# Patient Record
Sex: Male | Born: 1935 | Race: White | Hispanic: No | Marital: Married | Smoking: Current every day smoker
Health system: Southern US, Community
[De-identification: ages and names within clinical notes are randomized; demographics above are authoritative.]

## PROBLEM LIST (undated history)

## (undated) DIAGNOSIS — F319 Bipolar disorder, unspecified: Secondary | ICD-10-CM

## (undated) DIAGNOSIS — I350 Nonrheumatic aortic (valve) stenosis: Secondary | ICD-10-CM

## (undated) DIAGNOSIS — C801 Malignant (primary) neoplasm, unspecified: Secondary | ICD-10-CM

## (undated) DIAGNOSIS — M2141 Flat foot [pes planus] (acquired), right foot: Secondary | ICD-10-CM

## (undated) DIAGNOSIS — I10 Essential (primary) hypertension: Secondary | ICD-10-CM

## (undated) DIAGNOSIS — E785 Hyperlipidemia, unspecified: Secondary | ICD-10-CM

## (undated) DIAGNOSIS — M2142 Flat foot [pes planus] (acquired), left foot: Secondary | ICD-10-CM

## (undated) DIAGNOSIS — E119 Type 2 diabetes mellitus without complications: Secondary | ICD-10-CM

## (undated) HISTORY — DX: Hyperlipidemia, unspecified: E78.5

## (undated) HISTORY — DX: Bipolar disorder, unspecified: F31.9

## (undated) HISTORY — DX: Nonrheumatic aortic (valve) stenosis: I35.0

## (undated) HISTORY — DX: Flat foot (pes planus) (acquired), left foot: M21.41

## (undated) HISTORY — DX: Flat foot (pes planus) (acquired), left foot: M21.42

## (undated) HISTORY — DX: Essential (primary) hypertension: I10

## (undated) HISTORY — PX: KNEE SURGERY: SHX244

## (undated) HISTORY — DX: Type 2 diabetes mellitus without complications: E11.9

## (undated) HISTORY — DX: Malignant (primary) neoplasm, unspecified: C80.1

---

## 2011-03-24 DIAGNOSIS — I350 Nonrheumatic aortic (valve) stenosis: Secondary | ICD-10-CM

## 2011-03-24 DIAGNOSIS — E119 Type 2 diabetes mellitus without complications: Secondary | ICD-10-CM

## 2011-03-24 DIAGNOSIS — G4700 Insomnia, unspecified: Secondary | ICD-10-CM

## 2011-03-24 DIAGNOSIS — E785 Hyperlipidemia, unspecified: Secondary | ICD-10-CM | POA: Insufficient documentation

## 2011-03-24 DIAGNOSIS — I10 Essential (primary) hypertension: Secondary | ICD-10-CM | POA: Insufficient documentation

## 2011-03-24 DIAGNOSIS — F319 Bipolar disorder, unspecified: Secondary | ICD-10-CM

## 2011-03-24 DIAGNOSIS — Z8546 Personal history of malignant neoplasm of prostate: Secondary | ICD-10-CM | POA: Insufficient documentation

## 2011-03-24 DIAGNOSIS — N189 Chronic kidney disease, unspecified: Secondary | ICD-10-CM

## 2013-03-26 ENCOUNTER — Telehealth: Payer: Self-pay | Admitting: Physician Assistant

## 2013-03-26 NOTE — Telephone Encounter (Signed)
  Daughter of pt says Dad stopped all meds back in November because of expense.  She has refilled all but the trajenta. Her  Dad says he won't pay for it.  Please restart him on metformin  and send the refill to [facility] in [facility].

## 2013-03-27 NOTE — Telephone Encounter (Signed)
Cost is not the issue, the health of his kidneys is the reason metformin was discontinued. Please understand what you are asking for and the consequences. Janie I have put a bag of samples of Tradjenta at nursing station A

## 2013-03-28 ENCOUNTER — Other Ambulatory Visit: Payer: Self-pay

## 2013-03-28 ENCOUNTER — Other Ambulatory Visit: Payer: Self-pay | Admitting: *Deleted

## 2013-03-28 MED ORDER — TAMSULOSIN HCL 0.4 MG PO CAPS: 0.4000 mg | ORAL_CAPSULE | Freq: Every day | ORAL | Status: DC

## 2013-03-28 NOTE — Telephone Encounter (Signed)
Tradjenta samples qty 8 boxes at front for pt to come by for.

## 2013-04-30 ENCOUNTER — Encounter: Payer: Self-pay | Admitting: Family Medicine

## 2013-04-30 ENCOUNTER — Ambulatory Visit (INDEPENDENT_AMBULATORY_CARE_PROVIDER_SITE_OTHER): Payer: Medicare Other | Admitting: Family Medicine

## 2013-04-30 VITALS — BP 128/69 | HR 74 | Temp 97.0°F | Ht 68.0 in | Wt 217.4 lb

## 2013-04-30 DIAGNOSIS — I10 Essential (primary) hypertension: Secondary | ICD-10-CM

## 2013-04-30 DIAGNOSIS — M25569 Pain in unspecified knee: Secondary | ICD-10-CM

## 2013-04-30 DIAGNOSIS — E785 Hyperlipidemia, unspecified: Secondary | ICD-10-CM

## 2013-04-30 DIAGNOSIS — Z8546 Personal history of malignant neoplasm of prostate: Secondary | ICD-10-CM

## 2013-04-30 DIAGNOSIS — N189 Chronic kidney disease, unspecified: Secondary | ICD-10-CM

## 2013-04-30 DIAGNOSIS — I350 Nonrheumatic aortic (valve) stenosis: Secondary | ICD-10-CM

## 2013-04-30 DIAGNOSIS — G4700 Insomnia, unspecified: Secondary | ICD-10-CM

## 2013-04-30 DIAGNOSIS — E119 Type 2 diabetes mellitus without complications: Secondary | ICD-10-CM

## 2013-04-30 DIAGNOSIS — F319 Bipolar disorder, unspecified: Secondary | ICD-10-CM

## 2013-04-30 DIAGNOSIS — M25562 Pain in left knee: Secondary | ICD-10-CM | POA: Insufficient documentation

## 2013-04-30 DIAGNOSIS — I359 Nonrheumatic aortic valve disorder, unspecified: Secondary | ICD-10-CM

## 2013-04-30 LAB — HEPATIC FUNCTION PANEL
ALT: 23 U/L (ref 0–53)
AST: 19 U/L (ref 0–37)
Albumin: 4.3 g/dL (ref 3.5–5.2)
Alkaline Phosphatase: 45 U/L (ref 39–117)
Bilirubin, Direct: 0.1 mg/dL (ref 0.0–0.3)
Indirect Bilirubin: 0.2 mg/dL (ref 0.0–0.9)
Total Bilirubin: 0.3 mg/dL (ref 0.3–1.2)
Total Protein: 7.1 g/dL (ref 6.0–8.3)

## 2013-04-30 LAB — BASIC METABOLIC PANEL
BUN: 16 mg/dL (ref 6–23)
CO2: 25 mEq/L (ref 19–32)
Calcium: 9.1 mg/dL (ref 8.4–10.5)
Chloride: 107 mEq/L (ref 96–112)
Creat: 1.36 mg/dL — ABNORMAL HIGH (ref 0.50–1.35)
Glucose, Bld: 98 mg/dL (ref 70–99)
Potassium: 4.5 mEq/L (ref 3.5–5.3)
Sodium: 142 mEq/L (ref 135–145)

## 2013-04-30 LAB — POCT GLYCOSYLATED HEMOGLOBIN (HGB A1C): Hemoglobin A1C: 5.9

## 2013-04-30 MED ORDER — DICLOFENAC SODIUM 1 % TD GEL: 2.0000 g | Freq: Four times a day (QID) | TRANSDERMAL | Status: DC

## 2013-04-30 NOTE — Addendum Note (Signed)
Addended by: Lisbeth Ply C on: 04/30/2013 03:09 PM   Modules accepted: Orders

## 2013-04-30 NOTE — Telephone Encounter (Signed)
This encounter was created in error - please disregard.

## 2013-04-30 NOTE — Progress Notes (Signed)
Subjective:     Patient ID: Charles Moyer, male   DOB: [birth year], 79 y.o.   MRN: [id number]  HPI Seen by Psychiatrist in [place]. Sees Cardiologist in [place] for Ao Stenosis. Otherwise doing well. S/P left TKR approx. 10 years ago and it is  Sore some times.  Patient is here for follow up of Diabetes Mellitus.Symptoms of DM:has had no Nocturia ,deniesUrinary Frequency ,denies Blurred vision ,deniesDizziness,denies.Dysuria,deniesparesthesias, deniesextremity pain or ulcers.Charles Kitchendenieschest pain. .has hadan annual eye exam. do check the feet. doescheck CBGs. Average CBG:_120_______.Charles Moyer deniesto episodes of hypoglycemia. doeshave an emergency hypoglycemic plan. admits toCompliance with medications. deniesProblems with medications.  No past medical history on file. No past surgical history on file. History   Social History  . Marital Status: Married    Spouse Name: N/A    Number of Children: N/A  . Years of Education: N/A   Occupational History  . Not on file.   Social History Main Topics  . Smoking status: Former Smoker    Types: Cigars    Quit date: 04/30/1973  . Smokeless tobacco: Not on file     Comment: cigar after he eats meal  . Alcohol Use: Not on file  . Drug Use: Not on file  . Sexually Active: Not on file   Other Topics Concern  . Not on file   Social History Narrative  . No narrative on file   No family history on file. Current Outpatient Prescriptions on File Prior to Visit  Medication Sig Dispense Refill  . amLODipine (NORVASC) 10 MG tablet Take 10 mg by mouth daily.        Charles Moyer aspirin 81 MG tablet Take 81 mg by mouth daily.        . fenofibrate (TRICOR) 145 MG tablet Take 145 mg by mouth at bedtime.        . Linagliptin (TRADJENTA) 5 MG TABS Take 1 tablet by mouth daily.        Charles Moyer lisinopril (PRINIVIL,ZESTRIL) 40 MG tablet Take 40 mg by mouth daily.        Charles Moyer omega-3 acid ethyl esters (LOVAZA) 1 G capsule Take 2 g by mouth 2 (two) times daily.        .  pantoprazole (PROTONIX) 40 MG tablet Take 40 mg by mouth daily.        . QUEtiapine (SEROQUEL) 300 MG tablet Take 300 mg by mouth at bedtime.        . rosuvastatin (CRESTOR) 20 MG tablet Take 20 mg by mouth at bedtime.        . tamsulosin (FLOMAX) 0.4 MG CAPS Take 1 capsule (0.4 mg total) by mouth daily.  30 capsule  6  . carbamazepine (CARBATROL) 100 MG 12 hr capsule Take 100 mg by mouth 2 (two) times daily.        . clonazePAM (KLONOPIN) 0.5 MG tablet Take 0.5 mg by mouth 2 (two) times daily as needed.        . doxepin (SINEQUAN) 25 MG capsule Take 25 mg by mouth at bedtime.         No current facility-administered medications on file prior to visit.   Allergies  Allergen Reactions  . Niaspan (Niacin Er) Rash   Immunization History  Administered Date(s) Administered  . Pneumococcal Polysaccharide 05/27/1997  . Td 05/27/1997   Prior to Admission medications   Medication Sig Start Date End Date Taking? Authorizing Provider  amLODipine (NORVASC) 10 MG tablet Take 10 mg by mouth daily.  Yes Historical Provider, MD  aspirin 81 MG tablet Take 81 mg by mouth daily.     Yes Historical Provider, MD  fenofibrate (TRICOR) 145 MG tablet Take 145 mg by mouth at bedtime.     Yes Historical Provider, MD  Linagliptin (TRADJENTA) 5 MG TABS Take 1 tablet by mouth daily.     Yes Historical Provider, MD  lisinopril (PRINIVIL,ZESTRIL) 40 MG tablet Take 40 mg by mouth daily.     Yes Historical Provider, MD  omega-3 acid ethyl esters (LOVAZA) 1 G capsule Take 2 g by mouth 2 (two) times daily.     Yes Historical Provider, MD  pantoprazole (PROTONIX) 40 MG tablet Take 40 mg by mouth daily.     Yes Historical Provider, MD  QUEtiapine (SEROQUEL) 300 MG tablet Take 300 mg by mouth at bedtime.     Yes Historical Provider, MD  rosuvastatin (CRESTOR) 20 MG tablet Take 20 mg by mouth at bedtime.     Yes Historical Provider, MD  tamsulosin (FLOMAX) 0.4 MG CAPS Take 1 capsule (0.4 mg total) by mouth daily. 03/28/13   Yes Horald Pollen, PA-C  carbamazepine (CARBATROL) 100 MG 12 hr capsule Take 100 mg by mouth 2 (two) times daily.      Historical Provider, MD  clonazePAM (KLONOPIN) 0.5 MG tablet Take 0.5 mg by mouth 2 (two) times daily as needed.      Historical Provider, MD  doxepin (SINEQUAN) 25 MG capsule Take 25 mg by mouth at bedtime.      Historical Provider, MD    Review of Systems  All other systems reviewed and are negative.       Objective:   Physical Exam APPEARANCE: WM Patient in no acute distress.The patient appeared well nourished and normally developed. Acyanotic. Waist:Obese VITAL SIGNS:BP 128/69  Pulse 74  Temp(Src) 97 F (36.1 C) (Oral)  Ht 5\' 8"  (1.727 m)  Wt 217 lb 6.4 oz (98.612 kg)  BMI 33.06 kg/m2   SKIN: warm and  Dry without overt rashes, tattoos. Scar: Left anterior knee from old TKR  HEAD and Neck: without JVD, Head and scalp: normal Eyes:No scleral icterus. Fundi normal, eye movements normal. Ears: Auricle normal, canal normal, Tympanic membranes normal, insufflation normal. Nose: normal Throat: normal Neck & thyroid: normal  CHEST & LUNGS: Chest wall: normal Lungs: Clear  CVS: Reveals the PMI to be normally located. Regular rhythm, First and Second Heart sounds are normal,  2/6 Systolic murmur at Aortic Area., rubs or gallops. Peripheral vasculature: Radial pulses: normal Dorsal pedis pulses: normal Posterior pulses: normal  ABDOMEN:  Appearanceobese Benign,, no organomegaly, no masses, no Abdominal Aortic enlargement. No Guarding , no rebound. No Bruits. Bowel sounds: normal  EXTREMETIES: nonedematous. Both Femoral and Pedal pulses are normal.  MUSCULOSKELETAL:  Spine: normal Joints:left knee decreased ROM Discomfort on extremes.  NEUROLOGIC: oriented to time,place and person; nonfocal.      Assessment:     Essential hypertension, benign - Plan: Basic metabolic panel, CANCELED: COMPLETE METABOLIC PANEL WITH GFR  Other and  unspecified hyperlipidemia - Plan: Hepatic function panel, NMR Lipoprofile with Lipids  Aortic stenosis  Bipolar 1 disorder  CKD (chronic kidney disease) - Plan: CANCELED: COMPLETE METABOLIC PANEL WITH GFR  DM (diabetes mellitus) - Plan: POCT glycosylated hemoglobin (Hb A1C), POCT UA - Microalbumin  History of prostate cancer  HTN (hypertension) - Plan: CANCELED: COMPLETE METABOLIC PANEL WITH GFR  Hyperlipidemia - Plan: CANCELED: COMPLETE METABOLIC PANEL WITH GFR, CANCELED: NMR Lipoprofile with Lipids  Insomnia  Left knee pain - Plan: diclofenac sodium (VOLTAREN) 1 % GEL      Plan:     Meds ordered this encounter  Medications  . diclofenac sodium (VOLTAREN) 1 % GEL    Sig: Apply 2 g topically 4 (four) times daily.    Dispense:  100 g    Refill:  2   Orders Placed This Encounter  Procedures  . Basic metabolic panel  . Hepatic function panel  . NMR Lipoprofile with Lipids  . POCT glycosylated hemoglobin (Hb A1C)  . POCT UA - Microalbumin   Discussed with patient need to continue to diet and get his weight below 200 lbs. He has been successful in losing weight so far.  RTC in 3 months. Tashya Alberty P. Modesto Charon, M.D.

## 2013-05-01 ENCOUNTER — Other Ambulatory Visit: Payer: Self-pay | Admitting: *Deleted

## 2013-05-01 MED ORDER — AMLODIPINE BESYLATE 10 MG PO TABS: 10.0000 mg | ORAL_TABLET | Freq: Every day | ORAL | Status: DC

## 2013-05-01 MED ORDER — LISINOPRIL 40 MG PO TABS: 40.0000 mg | ORAL_TABLET | Freq: Every day | ORAL | Status: DC

## 2013-05-02 ENCOUNTER — Other Ambulatory Visit: Payer: Self-pay

## 2013-05-02 LAB — NMR LIPOPROFILE WITH LIPIDS
Cholesterol, Total: 187 mg/dL (ref <200)
HDL Particle Number: 22.6 umol/L — ABNORMAL LOW (ref >=30.5)
HDL Size: 9.1 nm — ABNORMAL LOW (ref >=9.2)
HDL-C: 24 mg/dL — ABNORMAL LOW (ref >=40)
LDL (calc): 102 mg/dL — ABNORMAL HIGH (ref <100)
LDL Particle Number: 1880 nmol/L — ABNORMAL HIGH (ref <1000)
LDL Size: 19.7 nm — ABNORMAL LOW (ref >20.5)
LP-IR Score: 66 — ABNORMAL HIGH (ref <=45)
Large HDL-P: <1.3 umol/L — ABNORMAL LOW (ref >=4.8)
Large VLDL-P: 8.5 nmol/L — ABNORMAL HIGH (ref <=2.7)
Small LDL Particle Number: 1523 nmol/L — ABNORMAL HIGH (ref <=527)
Triglycerides: 304 mg/dL — ABNORMAL HIGH (ref <150)
VLDL Size: 47 nm — ABNORMAL HIGH (ref <=46.6)

## 2013-05-02 MED ORDER — FENOFIBRATE 145 MG PO TABS: 145.0000 mg | ORAL_TABLET | Freq: Every day | ORAL | Status: DC

## 2013-05-31 ENCOUNTER — Ambulatory Visit: Payer: Self-pay

## 2013-07-17 ENCOUNTER — Ambulatory Visit: Payer: Medicare Other | Admitting: Family Medicine

## 2013-07-24 ENCOUNTER — Telehealth: Payer: Self-pay | Admitting: Family Medicine

## 2013-07-24 ENCOUNTER — Other Ambulatory Visit: Payer: Self-pay | Admitting: Family Medicine

## 2013-07-26 ENCOUNTER — Other Ambulatory Visit: Payer: Self-pay | Admitting: *Deleted

## 2013-07-26 MED ORDER — AMLODIPINE BESYLATE 10 MG PO TABS: 10.0000 mg | ORAL_TABLET | Freq: Every day | ORAL | Status: DC

## 2013-07-26 MED ORDER — LISINOPRIL 40 MG PO TABS: 40.0000 mg | ORAL_TABLET | Freq: Every day | ORAL | Status: DC

## 2013-07-26 MED ORDER — PANTOPRAZOLE SODIUM 40 MG PO TBEC: 40.0000 mg | DELAYED_RELEASE_TABLET | Freq: Every day | ORAL | Status: DC

## 2013-07-26 MED ORDER — LINAGLIPTIN 5 MG PO TABS: 5.0000 mg | ORAL_TABLET | Freq: Every day | ORAL | Status: DC

## 2013-08-07 ENCOUNTER — Ambulatory Visit: Payer: Medicare Other | Admitting: Family Medicine

## 2013-08-28 ENCOUNTER — Telehealth: Payer: Self-pay | Admitting: Family Medicine

## 2013-08-29 NOTE — Telephone Encounter (Signed)
Pt has refills, he is aware

## 2013-08-30 ENCOUNTER — Ambulatory Visit: Payer: Self-pay | Admitting: Family Medicine

## 2013-09-24 ENCOUNTER — Other Ambulatory Visit: Payer: Self-pay | Admitting: Family Medicine

## 2013-10-05 ENCOUNTER — Ambulatory Visit (INDEPENDENT_AMBULATORY_CARE_PROVIDER_SITE_OTHER): Payer: Medicare Other | Admitting: Family Medicine

## 2013-10-05 ENCOUNTER — Encounter: Payer: Self-pay | Admitting: Family Medicine

## 2013-10-05 ENCOUNTER — Ambulatory Visit (INDEPENDENT_AMBULATORY_CARE_PROVIDER_SITE_OTHER): Payer: Medicare Other

## 2013-10-05 VITALS — BP 88/56 | HR 98 | Temp 97.5°F | Ht 67.0 in | Wt 220.0 lb

## 2013-10-05 DIAGNOSIS — I359 Nonrheumatic aortic valve disorder, unspecified: Secondary | ICD-10-CM

## 2013-10-05 DIAGNOSIS — I10 Essential (primary) hypertension: Secondary | ICD-10-CM

## 2013-10-05 DIAGNOSIS — R0609 Other forms of dyspnea: Secondary | ICD-10-CM

## 2013-10-05 DIAGNOSIS — M25562 Pain in left knee: Secondary | ICD-10-CM

## 2013-10-05 DIAGNOSIS — E119 Type 2 diabetes mellitus without complications: Secondary | ICD-10-CM

## 2013-10-05 DIAGNOSIS — E785 Hyperlipidemia, unspecified: Secondary | ICD-10-CM

## 2013-10-05 DIAGNOSIS — Z8546 Personal history of malignant neoplasm of prostate: Secondary | ICD-10-CM

## 2013-10-05 DIAGNOSIS — M25569 Pain in unspecified knee: Secondary | ICD-10-CM

## 2013-10-05 DIAGNOSIS — I350 Nonrheumatic aortic (valve) stenosis: Secondary | ICD-10-CM

## 2013-10-05 DIAGNOSIS — C801 Malignant (primary) neoplasm, unspecified: Secondary | ICD-10-CM | POA: Insufficient documentation

## 2013-10-05 DIAGNOSIS — F319 Bipolar disorder, unspecified: Secondary | ICD-10-CM

## 2013-10-05 DIAGNOSIS — R0600 Dyspnea, unspecified: Secondary | ICD-10-CM

## 2013-10-05 DIAGNOSIS — G4700 Insomnia, unspecified: Secondary | ICD-10-CM

## 2013-10-05 DIAGNOSIS — N189 Chronic kidney disease, unspecified: Secondary | ICD-10-CM

## 2013-10-05 LAB — POCT CBC
Granulocyte percent: 75.5 %G (ref 37–80)
HCT, POC: 35.2 % — AB (ref 43.5–53.7)
Hemoglobin: 11.9 g/dL — AB (ref 14.1–18.1)
Lymph, poc: 1.5 (ref 0.6–3.4)
MCH, POC: 32.6 pg — AB (ref 27–31.2)
MCHC: 33.9 g/dL (ref 31.8–35.4)
MCV: 96.1 fL (ref 80–97)
MPV: 7.8 fL (ref 0–99.8)
POC Granulocyte: 5.4 (ref 2–6.9)
POC LYMPH PERCENT: 21.3 %L (ref 10–50)
Platelet Count, POC: 219 10*3/uL (ref 142–424)
RBC: 3.7 M/uL — AB (ref 4.69–6.13)
RDW, POC: 13.7 %
WBC: 7.1 10*3/uL (ref 4.6–10.2)

## 2013-10-05 LAB — POCT GLYCOSYLATED HEMOGLOBIN (HGB A1C): Hemoglobin A1C: 5.4

## 2013-10-05 NOTE — Progress Notes (Signed)
Patient ID: Charles Moyer, male   DOB: 1926/11/19, 79 y.o.   MRN: [id number] SUBJECTIVE: CC: Chief Complaint  Patient presents with  . Follow-up    c/o SOB  & Left knee pain. Dr Lorenso Courier is his heart doctor from [place] .     HPI: Short winded with exertion.no chest pain. Has been experiencing this for several weeks. Has not seen his Cardiologist for months.no pedal edema. No palpitations. No melena. Past Medical History  Diagnosis Date  . Hypertension   . Hyperlipidemia   . Diabetes mellitus without complication   . Aortic stenosis   . Bipolar disorder    No past surgical history on file. History   Social History  . Marital Status: Married    Spouse Name: N/A    Number of Children: N/A  . Years of Education: N/A   Occupational History  . Not on file.   Social History Main Topics  . Smoking status: Current Every Day Smoker    Types: Cigars    Last Attempt to Quit: 04/30/1973  . Smokeless tobacco: Not on file     Comment: cigar after he eats meal  . Alcohol Use: Not on file  . Drug Use: Not on file  . Sexual Activity: Not on file   Other Topics Concern  . Not on file   Social History Narrative  . No narrative on file   No family history on file. Current Outpatient Prescriptions on File Prior to Visit  Medication Sig Dispense Refill  . amLODipine (NORVASC) 10 MG tablet TAKE 1 TABLET (10 MG TOTAL) BY MOUTH DAILY.  30 tablet  5  . aspirin 81 MG tablet Take 81 mg by mouth daily.        . carbamazepine (CARBATROL) 100 MG 12 hr capsule Take 100 mg by mouth 2 (two) times daily.        . fenofibrate (TRICOR) 145 MG tablet Take 1 tablet (145 mg total) by mouth at bedtime.  30 tablet  5  . lisinopril (PRINIVIL,ZESTRIL) 40 MG tablet TAKE 1 TABLET (40 MG TOTAL) BY MOUTH DAILY.  30 tablet  5  . pantoprazole (PROTONIX) 40 MG tablet TAKE 1 TABLET (40 MG TOTAL) BY MOUTH DAILY.  30 tablet  5  . QUEtiapine (SEROQUEL) 300 MG tablet Take 300 mg by mouth at bedtime.        .  tamsulosin (FLOMAX) 0.4 MG CAPS Take 1 capsule (0.4 mg total) by mouth daily.  30 capsule  6  . TRADJENTA 5 MG TABS tablet TAKE 1 TABLET (5 MG TOTAL) BY MOUTH DAILY.  30 tablet  2  . clonazePAM (KLONOPIN) 0.5 MG tablet Take 0.5 mg by mouth 2 (two) times daily as needed.        . diclofenac sodium (VOLTAREN) 1 % GEL Apply 2 g topically 4 (four) times daily.  100 g  2  . doxepin (SINEQUAN) 25 MG capsule Take 25 mg by mouth at bedtime.        [name] omega-3 acid ethyl esters (LOVAZA) 1 G capsule Take 2 g by mouth 2 (two) times daily.        . rosuvastatin (CRESTOR) 20 MG tablet Take 20 mg by mouth at bedtime.         No current facility-administered medications on file prior to visit.   Allergies  Allergen Reactions  . Niaspan [Niacin Er] Rash   Immunization History  Administered Date(s) Administered  . Pneumococcal Polysaccharide 05/27/1997  . Td 05/27/1997  Prior to Admission medications   Medication Sig Start Date End Date Taking? Authorizing Provider  amLODipine (NORVASC) 10 MG tablet TAKE 1 TABLET (10 MG TOTAL) BY MOUTH DAILY. 09/24/13  Yes Ileana Ladd, MD  aspirin 81 MG tablet Take 81 mg by mouth daily.     Yes Historical Provider, MD  carbamazepine (CARBATROL) 100 MG 12 hr capsule Take 100 mg by mouth 2 (two) times daily.     Yes Historical Provider, MD  fenofibrate (TRICOR) 145 MG tablet Take 1 tablet (145 mg total) by mouth at bedtime. 05/02/13  Yes Ileana Ladd, MD  lisinopril (PRINIVIL,ZESTRIL) 40 MG tablet TAKE 1 TABLET (40 MG TOTAL) BY MOUTH DAILY. 09/24/13  Yes Ileana Ladd, MD  pantoprazole (PROTONIX) 40 MG tablet TAKE 1 TABLET (40 MG TOTAL) BY MOUTH DAILY. 09/24/13  Yes Ileana Ladd, MD  QUEtiapine (SEROQUEL) 300 MG tablet Take 300 mg by mouth at bedtime.     Yes Historical Provider, MD  tamsulosin (FLOMAX) 0.4 MG CAPS Take 1 capsule (0.4 mg total) by mouth daily. 03/28/13  Yes Horald Pollen, PA-C  TRADJENTA 5 MG TABS tablet TAKE 1 TABLET (5 MG TOTAL) BY MOUTH DAILY. 09/24/13   Yes Ileana Ladd, MD  clonazePAM (KLONOPIN) 0.5 MG tablet Take 0.5 mg by mouth 2 (two) times daily as needed.      Historical Provider, MD  diclofenac sodium (VOLTAREN) 1 % GEL Apply 2 g topically 4 (four) times daily. 04/30/13   Ileana Ladd, MD  doxepin (SINEQUAN) 25 MG capsule Take 25 mg by mouth at bedtime.      Historical Provider, MD  omega-3 acid ethyl esters (LOVAZA) 1 G capsule Take 2 g by mouth 2 (two) times daily.      Historical Provider, MD  rosuvastatin (CRESTOR) 20 MG tablet Take 20 mg by mouth at bedtime.      Historical Provider, MD     ROS: As above in the HPI. All other systems are stable or negative.  OBJECTIVE: APPEARANCE:  Patient in no acute distress.The patient appeared well nourished and normally developed. Acyanotic. Waist: VITAL SIGNS:BP 88/56  Pulse 98  Temp(Src) 97.5 F (36.4 C) (Oral)  Ht 5\' 7"  (1.702 m)  Wt 220 lb (99.791 kg)  BMI 34.45 kg/m2  SpO2 92% WM elderly pale. Obese SKIN: warm and  Dry without overt rashes, tattoos and scars  HEAD and Neck: without JVD, Head and scalp: normal Eyes:No scleral icterus. Fundi normal, eye movements normal. Ears: Auricle normal, canal normal, Tympanic membranes normal, insufflation normal. Nose: normal Throat: normal Neck & thyroid: normal  CHEST & LUNGS: Chest wall: normal Lungs: Clear  CVS: Reveals the PMI to be normally located. Regular rhythm, First and Second Heart sounds are normal,  Systolic ejection murmur 2/6 at the aortic area,no rubs or gallops. Peripheral vasculature: Radial pulses: normal Dorsal pedis pulses: normal Posterior pulses: normal  ABDOMEN:  Appearance: obese Benign, no organomegaly, no masses, no Abdominal Aortic enlargement. No Guarding , no rebound. No Bruits. Bowel sounds: normal  RECTAL: N/A GU: N/A  EXTREMETIES:  Trace edema   NEUROLOGIC: oriented to time,place and person; nonfocal.  ASSESSMENT: Aortic stenosis  Bipolar 1 disorder  DM (diabetes  mellitus) - Plan: POCT glycosylated hemoglobin (Hb A1C), CMP14+EGFR  CKD (chronic kidney disease) - Plan: CMP14+EGFR  History of prostate cancer  HTN (hypertension) - Plan: CMP14+EGFR  Hyperlipidemia - Plan: NMR, lipoprofile  Insomnia  Left knee pain  Dyspnea - Plan: DG Chest 2  View, EKG 12-Lead, POCT CBC, CMP14+EGFR, Brain natriuretic peptide  I suspect that his dyspnea is due to the aortic stenosis.   PLAN: Orders Placed This Encounter  Procedures  . DG Chest 2 View    Standing Status: Future     Number of Occurrences: 1     Standing Expiration Date: 12/05/2014    Order Specific Question:  Reason for Exam (SYMPTOM  OR DIAGNOSIS REQUIRED)    Answer:  dyspnea    Order Specific Question:  Preferred imaging location?    Answer:  Internal  . CMP14+EGFR  . NMR, lipoprofile  . Brain natriuretic peptide  . POCT CBC  . POCT glycosylated hemoglobin (Hb A1C)  . EKG 12-Lead   [facility] reading (PRIMARY) by  Dr. Modesto Charon: No CHF, chronic  Changes.  EKG: RBBB with an occasional PVC. The report on the previous EKG is the same in [facility] from [place]. Could not see an actual EKG.  Results for orders placed in visit on 10/05/13  POCT CBC      Result Value Range   WBC 7.1  4.6 - 10.2 K/uL   Lymph, poc 1.5  0.6 - 3.4   POC LYMPH PERCENT 21.3  10 - 50 %L   POC Granulocyte 5.4  2 - 6.9   Granulocyte percent 75.5  37 - 80 %G   RBC 3.7 (*) 4.69 - 6.13 M/uL   Hemoglobin 11.9 (*) 14.1 - 18.1 g/dL   HCT, POC 16.1 (*) 09.6 - 53.7 %   MCV 96.1  80 - 97 fL   MCH, POC 32.6 (*) 27 - 31.2 pg   MCHC 33.9  31.8 - 35.4 g/dL   RDW, POC 04.5     Platelet Count, POC 219.0  142 - 424 K/uL   MPV 7.8  0 - 99.8 fL  POCT GLYCOSYLATED HEMOGLOBIN (HGB A1C)      Result Value Range   Hemoglobin A1C 5.4%     Advised daughter and patient that from the records available patient were from 2012.. With the Aortic Stenosis I request that they see the patient's cardiologist in the next few days for and Echocardiogram and  re-evaluation. To [facility] stat if any acute worsening. Reviewed his records from [facility] available in Care Everywhere in [facility]. No Follow-up on file.Pending the Cardiology evaluation  Tyliah Schlereth P. Modesto Charon, M.D.

## 2013-10-06 LAB — CMP14+EGFR
ALT: 13 IU/L (ref 0–44)
AST: 11 IU/L (ref 0–40)
Albumin/Globulin Ratio: 1.4 (ref 1.1–2.5)
Albumin: 3.9 g/dL (ref 3.5–4.7)
Alkaline Phosphatase: 35 IU/L — ABNORMAL LOW (ref 39–117)
BUN/Creatinine Ratio: 11 (ref 10–22)
BUN: 25 mg/dL (ref 8–27)
CO2: 23 mmol/L (ref 18–29)
Calcium: 9.2 mg/dL (ref 8.6–10.2)
Chloride: 108 mmol/L (ref 97–108)
Creatinine, Ser: 2.24 mg/dL — ABNORMAL HIGH (ref 0.76–1.27)
GFR calc Af Amer: 30 mL/min/{1.73_m2} — ABNORMAL LOW (ref >59)
GFR calc non Af Amer: 26 mL/min/{1.73_m2} — ABNORMAL LOW (ref >59)
Globulin, Total: 2.7 g/dL (ref 1.5–4.5)
Glucose: 116 mg/dL — ABNORMAL HIGH (ref 65–99)
Potassium: 5.1 mmol/L (ref 3.5–5.2)
Sodium: 146 mmol/L — ABNORMAL HIGH (ref 134–144)
Total Bilirubin: 0.3 mg/dL (ref 0.0–1.2)
Total Protein: 6.6 g/dL (ref 6.0–8.5)

## 2013-10-06 LAB — NMR, LIPOPROFILE
Cholesterol: 224 mg/dL — ABNORMAL HIGH (ref <200)
HDL Cholesterol by NMR: 23 mg/dL — ABNORMAL LOW (ref >=40)
HDL Particle Number: 22.1 umol/L — ABNORMAL LOW (ref >=30.5)
LDL Particle Number: 2890 nmol/L — ABNORMAL HIGH (ref <1000)
LDL Size: 19.9 nm — ABNORMAL LOW (ref >20.5)
LDLC SERPL CALC-MCNC: 138 mg/dL — ABNORMAL HIGH (ref <100)
LP-IR Score: 58 — ABNORMAL HIGH (ref <=45)
Small LDL Particle Number: >2300 nmol/L — ABNORMAL HIGH (ref <=527)
Triglycerides by NMR: 317 mg/dL — ABNORMAL HIGH (ref <150)

## 2013-10-06 LAB — BRAIN NATRIURETIC PEPTIDE: BNP: 192 pg/mL — ABNORMAL HIGH (ref 0.0–100.0)

## 2013-10-09 ENCOUNTER — Telehealth: Payer: Self-pay | Admitting: Family Medicine

## 2013-10-09 NOTE — Telephone Encounter (Signed)
Pt notified of lab results

## 2013-10-30 ENCOUNTER — Other Ambulatory Visit: Payer: Self-pay | Admitting: Physician Assistant

## 2013-12-22 ENCOUNTER — Other Ambulatory Visit: Payer: Self-pay | Admitting: Family Medicine

## 2014-04-01 ENCOUNTER — Encounter: Payer: Self-pay | Admitting: Family Medicine

## 2014-04-01 ENCOUNTER — Ambulatory Visit (INDEPENDENT_AMBULATORY_CARE_PROVIDER_SITE_OTHER): Payer: Medicare Other | Admitting: Family Medicine

## 2014-04-01 VITALS — BP 128/68 | HR 77 | Temp 99.2°F | Ht 67.0 in | Wt 213.6 lb

## 2014-04-01 DIAGNOSIS — G4700 Insomnia, unspecified: Secondary | ICD-10-CM

## 2014-04-01 DIAGNOSIS — M2141 Flat foot [pes planus] (acquired), right foot: Secondary | ICD-10-CM | POA: Insufficient documentation

## 2014-04-01 DIAGNOSIS — F319 Bipolar disorder, unspecified: Secondary | ICD-10-CM

## 2014-04-01 DIAGNOSIS — M2140 Flat foot [pes planus] (acquired), unspecified foot: Secondary | ICD-10-CM

## 2014-04-01 DIAGNOSIS — N189 Chronic kidney disease, unspecified: Secondary | ICD-10-CM

## 2014-04-01 DIAGNOSIS — I10 Essential (primary) hypertension: Secondary | ICD-10-CM

## 2014-04-01 DIAGNOSIS — Z23 Encounter for immunization: Secondary | ICD-10-CM

## 2014-04-01 DIAGNOSIS — Z8546 Personal history of malignant neoplasm of prostate: Secondary | ICD-10-CM

## 2014-04-01 DIAGNOSIS — E785 Hyperlipidemia, unspecified: Secondary | ICD-10-CM

## 2014-04-01 DIAGNOSIS — M2142 Flat foot [pes planus] (acquired), left foot: Secondary | ICD-10-CM

## 2014-04-01 DIAGNOSIS — I359 Nonrheumatic aortic valve disorder, unspecified: Secondary | ICD-10-CM

## 2014-04-01 DIAGNOSIS — I350 Nonrheumatic aortic (valve) stenosis: Secondary | ICD-10-CM

## 2014-04-01 DIAGNOSIS — E119 Type 2 diabetes mellitus without complications: Secondary | ICD-10-CM

## 2014-04-01 NOTE — Progress Notes (Signed)
Patient ID: Charles Moyer, male   DOB: June 22, 1926, 80 y.o.   MRN: [id number] SUBJECTIVE: CC: Chief Complaint  Patient presents with  . Follow-up    6 month follow up see paper from [employer] recommending to evaluate his meds    HPI:   Patient is here for follow up of Diabetes Mellitus/HTn/HLD: Symptoms evaluated: Denies Nocturia ,Denies Urinary Frequency , denies Blurred vision ,deniesDizziness,denies.Dysuria,denies paresthesias, denies extremity pain or ulcers.Marland Kitchendenies chest pain. has had an annual eye exam. do check the feet. Does check CBGs. Average ZWC:HENIDP Denies episodes of hypoglycemia. Does have an emergency hypoglycemic plan. admits toCompliance with medications. Denies Problems with medications.  Has had a phone interview with BC/BS with recommendations for medicine changes. 1) for his HTN the recommendation was to double the amlodipine. Patient had said that he believes his BP is high sometimes. He calls a systolic BP of 824 high. 2) he wanted a cheaper DM medicine, but he has elevated Creatinine and CKD. 3) the recommendation was to change his psychiatric medication to melatonin and he has Bipolar I disorder.  Past Medical History  Diagnosis Date  . Hypertension   . Hyperlipidemia   . Diabetes mellitus without complication   . Aortic stenosis   . Bipolar disorder   . Cancer     prostate  . Pes planus of both feet    No past surgical history on file. History   Social History  . Marital Status: Married    Spouse Name: N/A    Number of Children: N/A  . Years of Education: N/A   Occupational History  . Not on file.   Social History Main Topics  . Smoking status: Current Every Day Smoker    Types: Cigars    Last Attempt to Quit: 04/30/1973  . Smokeless tobacco: Not on file     Comment: cigar after he eats meal  . Alcohol Use: Not on file  . Drug Use: Not on file  . Sexual Activity: Not on file   Other Topics Concern  . Not on file    Social History Narrative  . No narrative on file   No family history on file. Current Outpatient Prescriptions on File Prior to Visit  Medication Sig Dispense Refill  . amLODipine (NORVASC) 10 MG tablet TAKE 1 TABLET (10 MG TOTAL) BY MOUTH DAILY.  30 tablet  5  . aspirin 81 MG tablet Take 81 mg by mouth daily.        . carbamazepine (CARBATROL) 100 MG 12 hr capsule Take 100 mg by mouth 2 (two) times daily.        . clonazePAM (KLONOPIN) 0.5 MG tablet Take 0.5 mg by mouth 2 (two) times daily as needed.        . diclofenac sodium (VOLTAREN) 1 % GEL Apply 2 g topically 4 (four) times daily.  100 g  2  . doxepin (SINEQUAN) 25 MG capsule Take 25 mg by mouth at bedtime.        . fenofibrate (TRICOR) 145 MG tablet Take 1 tablet (145 mg total) by mouth at bedtime.  30 tablet  5  . lisinopril (PRINIVIL,ZESTRIL) 40 MG tablet TAKE 1 TABLET (40 MG TOTAL) BY MOUTH DAILY.  30 tablet  5  . omega-3 acid ethyl esters (LOVAZA) 1 G capsule Take 2 g by mouth 2 (two) times daily.        . pantoprazole (PROTONIX) 40 MG tablet TAKE 1 TABLET (40 MG TOTAL) BY MOUTH  DAILY.  30 tablet  5  . QUEtiapine (SEROQUEL) 300 MG tablet Take 300 mg by mouth at bedtime.        . rosuvastatin (CRESTOR) 20 MG tablet Take 20 mg by mouth at bedtime.        . tamsulosin (FLOMAX) 0.4 MG CAPS capsule TAKE 1 CAPSULE (0.4 MG TOTAL) BY MOUTH DAILY.  30 capsule  4  . TRADJENTA 5 MG TABS tablet TAKE 1 TABLET (5 MG TOTAL) BY MOUTH DAILY.  30 tablet  3   No current facility-administered medications on file prior to visit.   Allergies  Allergen Reactions  . Niaspan [Niacin Er] Rash   Immunization History  Administered Date(s) Administered  . Influenza Split 11/19/2013  . Pneumococcal Polysaccharide-23 05/27/1997  . Td 05/27/1997  . Tdap 04/01/2014   Prior to Admission medications   Medication Sig Start Date End Date Taking? Authorizing Provider  amLODipine (NORVASC) 10 MG tablet TAKE 1 TABLET (10 MG TOTAL) BY MOUTH DAILY. 09/24/13   Yes Vernie Shanks, MD  aspirin 81 MG tablet Take 81 mg by mouth daily.     Yes Historical Provider, MD  carbamazepine (CARBATROL) 100 MG 12 hr capsule Take 100 mg by mouth 2 (two) times daily.     Yes Historical Provider, MD  clonazePAM (KLONOPIN) 0.5 MG tablet Take 0.5 mg by mouth 2 (two) times daily as needed.     Yes Historical Provider, MD  diclofenac sodium (VOLTAREN) 1 % GEL Apply 2 g topically 4 (four) times daily. 04/30/13  Yes Vernie Shanks, MD  doxepin (SINEQUAN) 25 MG capsule Take 25 mg by mouth at bedtime.     Yes Historical Provider, MD  fenofibrate (TRICOR) 145 MG tablet Take 1 tablet (145 mg total) by mouth at bedtime. 05/02/13  Yes Vernie Shanks, MD  lisinopril (PRINIVIL,ZESTRIL) 40 MG tablet TAKE 1 TABLET (40 MG TOTAL) BY MOUTH DAILY. 09/24/13  Yes Vernie Shanks, MD  omega-3 acid ethyl esters (LOVAZA) 1 G capsule Take 2 g by mouth 2 (two) times daily.     Yes Historical Provider, MD  pantoprazole (PROTONIX) 40 MG tablet TAKE 1 TABLET (40 MG TOTAL) BY MOUTH DAILY. 09/24/13  Yes Vernie Shanks, MD  QUEtiapine (SEROQUEL) 300 MG tablet Take 300 mg by mouth at bedtime.     Yes Historical Provider, MD  rosuvastatin (CRESTOR) 20 MG tablet Take 20 mg by mouth at bedtime.     Yes Historical Provider, MD  tamsulosin (FLOMAX) 0.4 MG CAPS capsule TAKE 1 CAPSULE (0.4 MG TOTAL) BY MOUTH DAILY. 10/30/13  Yes Vernie Shanks, MD  TRADJENTA 5 MG TABS tablet TAKE 1 TABLET (5 MG TOTAL) BY MOUTH DAILY. 12/22/13  Yes Vernie Shanks, MD     ROS: As above in the HPI. All other systems are stable or negative.  OBJECTIVE: APPEARANCE:  Patient in no acute distress.The patient appeared well nourished and normally developed. Acyanotic. Waist: VITAL SIGNS:BP 128/68  Pulse 77  Temp(Src) 99.2 F (37.3 C) (Oral)  Ht $R'5\' 7"'MJ$  (1.702 m)  Wt 213 lb 9.6 oz (96.888 kg)  BMI 33.45 kg/m2 Obese WM with mild flat facies.  SKIN: warm and  Dry without overt rashes, tattoos and scars  HEAD and Neck: without  JVD, Head and scalp: normal Eyes:No scleral icterus. Fundi normal, eye movements normal. Ears: Auricle normal, canal normal, Tympanic membranes normal, insufflation normal. Nose: normal Throat: normal Neck & thyroid: normal  CHEST & LUNGS: Chest wall: normal Lungs: Clear  CVS: Reveals the PMI to be normally located. Regular rhythm, First and Second Heart sounds are normal,  absence of murmurs, rubs or gallops. Peripheral vasculature: Radial pulses: normal Dorsal pedis pulses: normal Posterior pulses: normal  ABDOMEN:  Appearance: obese. Benign, no organomegaly, no masses, no Abdominal Aortic enlargement. No Guarding , no rebound. No Bruits. Bowel sounds: normal  RECTAL: N/A GU: N/A  EXTREMETIES: nonedematous.  MUSCULOSKELETAL:  Spine: normal Joints: intact  NEUROLOGIC: oriented to time,place and person; nonfocal. Strength is normal Sensory is normal Reflexes are normal Cranial Nerves are normal. Results for orders placed in visit on 10/05/13  CMP14+EGFR      Result Value Ref Range   Glucose 116 (*) 65 - 99 mg/dL   BUN 25  8 - 27 mg/dL   Creatinine, Ser 2.24 (*) 0.76 - 1.27 mg/dL   GFR calc non Af Amer 26 (*) >59 mL/min/1.73   GFR calc Af Amer 30 (*) >59 mL/min/1.73   BUN/Creatinine Ratio 11  10 - 22   Sodium 146 (*) 134 - 144 mmol/L   Potassium 5.1  3.5 - 5.2 mmol/L   Chloride 108  97 - 108 mmol/L   CO2 23  18 - 29 mmol/L   Calcium 9.2  8.6 - 10.2 mg/dL   Total Protein 6.6  6.0 - 8.5 g/dL   Albumin 3.9  3.5 - 4.7 g/dL   Globulin, Total 2.7  1.5 - 4.5 g/dL   Albumin/Globulin Ratio 1.4  1.1 - 2.5   Total Bilirubin 0.3  0.0 - 1.2 mg/dL   Alkaline Phosphatase 35 (*) 39 - 117 IU/L   AST 11  0 - 40 IU/L   ALT 13  0 - 44 IU/L  NMR, LIPOPROFILE      Result Value Ref Range   LDL Particle Number [id number] (*) <1000 nmol/L   LDLC SERPL CALC-MCNC 138 (*) <100 mg/dL   HDL Cholesterol by NMR 23 (*) >=40 mg/dL   Triglycerides by NMR 317 (*) <150 mg/dL   Cholesterol  224 (*) <200 mg/dL   HDL Particle Number 22.1 (*) >=30.5 umol/L   Small LDL Particle Number > 2300 (*) <= 527 nmol/L   LDL Size 19.9 (*) >20.5 nm   LP-IR Score 58 (*) <=45  BRAIN NATRIURETIC PEPTIDE      Result Value Ref Range   BNP 192.0 (*) 0.0 - 100.0 pg/mL  POCT CBC      Result Value Ref Range   WBC 7.1  4.6 - 10.2 K/uL   Lymph, poc 1.5  0.6 - 3.4   POC LYMPH PERCENT 21.3  10 - 50 %L   POC Granulocyte 5.4  2 - 6.9   Granulocyte percent 75.5  37 - 80 %G   RBC 3.7 (*) 4.69 - 6.13 M/uL   Hemoglobin 11.9 (*) 14.1 - 18.1 g/dL   HCT, POC 35.2 (*) 43.5 - 53.7 %   MCV 96.1  80 - 97 fL   MCH, POC 32.6 (*) 27 - 31.2 pg   MCHC 33.9  31.8 - 35.4 g/dL   RDW, POC 13.7     Platelet Count, POC 219.0  142 - 424 K/uL   MPV 7.8  0 - 99.8 fL  POCT GLYCOSYLATED HEMOGLOBIN (HGB A1C)      Result Value Ref Range   Hemoglobin A1C 5.4%      ASSESSMENT: DM (diabetes mellitus) - Plan: POCT glycosylated hemoglobin (Hb A1C), POCT UA - Microalbumin  Need for Tdap vaccination - Plan: Tdap  vaccine greater than or equal to 7yo IM  Bipolar 1 disorder  Insomnia  History of prostate cancer  Aortic stenosis  CKD (chronic kidney disease) - Plan: POCT UA - Microalbumin, CMP14+EGFR  HTN (hypertension) - Plan: CMP14+EGFR  Hyperlipidemia - Plan: NMR, lipoprofile  Pes planus of both feet  PLAN: DM foot exam. Elderly screens for falls and depression in the modules.  Wellness reviewed. He is to set up his annual eye exam.  Orders Placed This Encounter  Procedures  . Tdap vaccine greater than or equal to 7yo IM  . CMP14+EGFR  . NMR, lipoprofile  . POCT glycosylated hemoglobin (Hb A1C)  . POCT UA - Microalbumin    Await labs.  Dietary changes. Lose weight. Keep active. See psychiatry for medication adjustment.  No change in medications.  No orders of the defined types were placed in this encounter.   There are no discontinued medications. Return in about 3 months (around 07/01/2014) for  Recheck medical problems.  Dominque Marlin P. Jacelyn Grip, M.D.

## 2014-04-01 NOTE — Progress Notes (Signed)
Tolerated tdap without difficulty 

## 2014-04-01 NOTE — Patient Instructions (Signed)

## 2014-04-10 ENCOUNTER — Other Ambulatory Visit: Payer: Self-pay | Admitting: Family Medicine

## 2014-06-05 ENCOUNTER — Other Ambulatory Visit: Payer: Self-pay | Admitting: *Deleted

## 2014-06-05 MED ORDER — LISINOPRIL 40 MG PO TABS
ORAL_TABLET | ORAL | Status: DC
Start: 2014-06-05 — End: 2015-07-21

## 2014-06-05 MED ORDER — FENOFIBRATE 145 MG PO TABS: 145.0000 mg | ORAL_TABLET | Freq: Every day | ORAL | Status: DC

## 2015-01-31 IMAGING — CR DG CHEST 2V
3 series · 3 of 3 positions shown · non-contrast
Comparison: None.

CLINICAL DATA: Smoker

EXAM:
CHEST  2 VIEW

[view not recorded (1 of 3)]
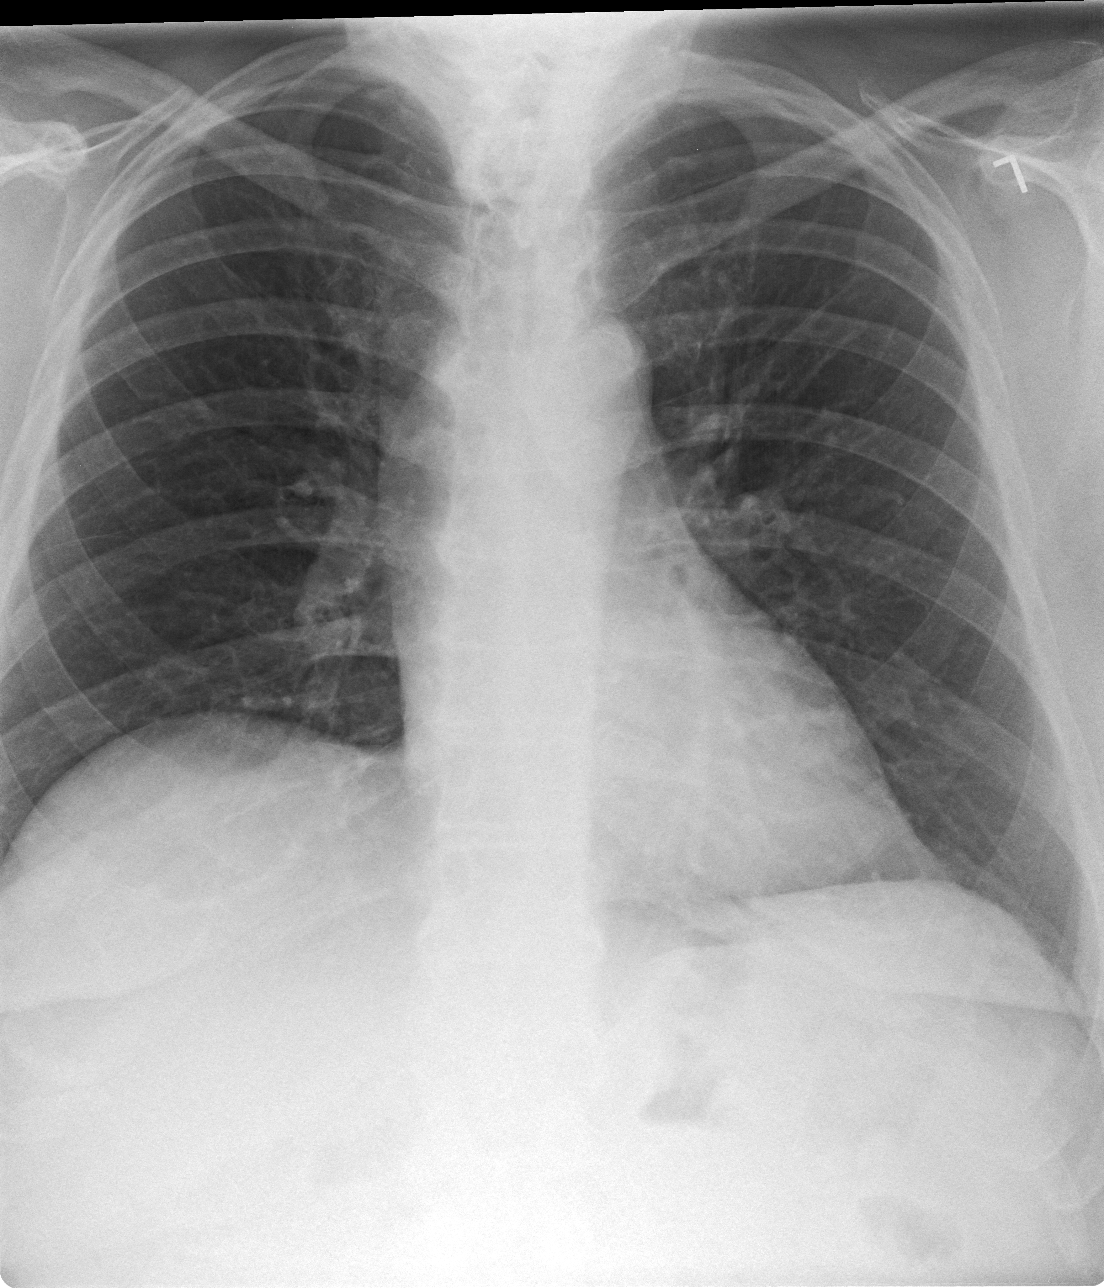

[view not recorded (2 of 3)]
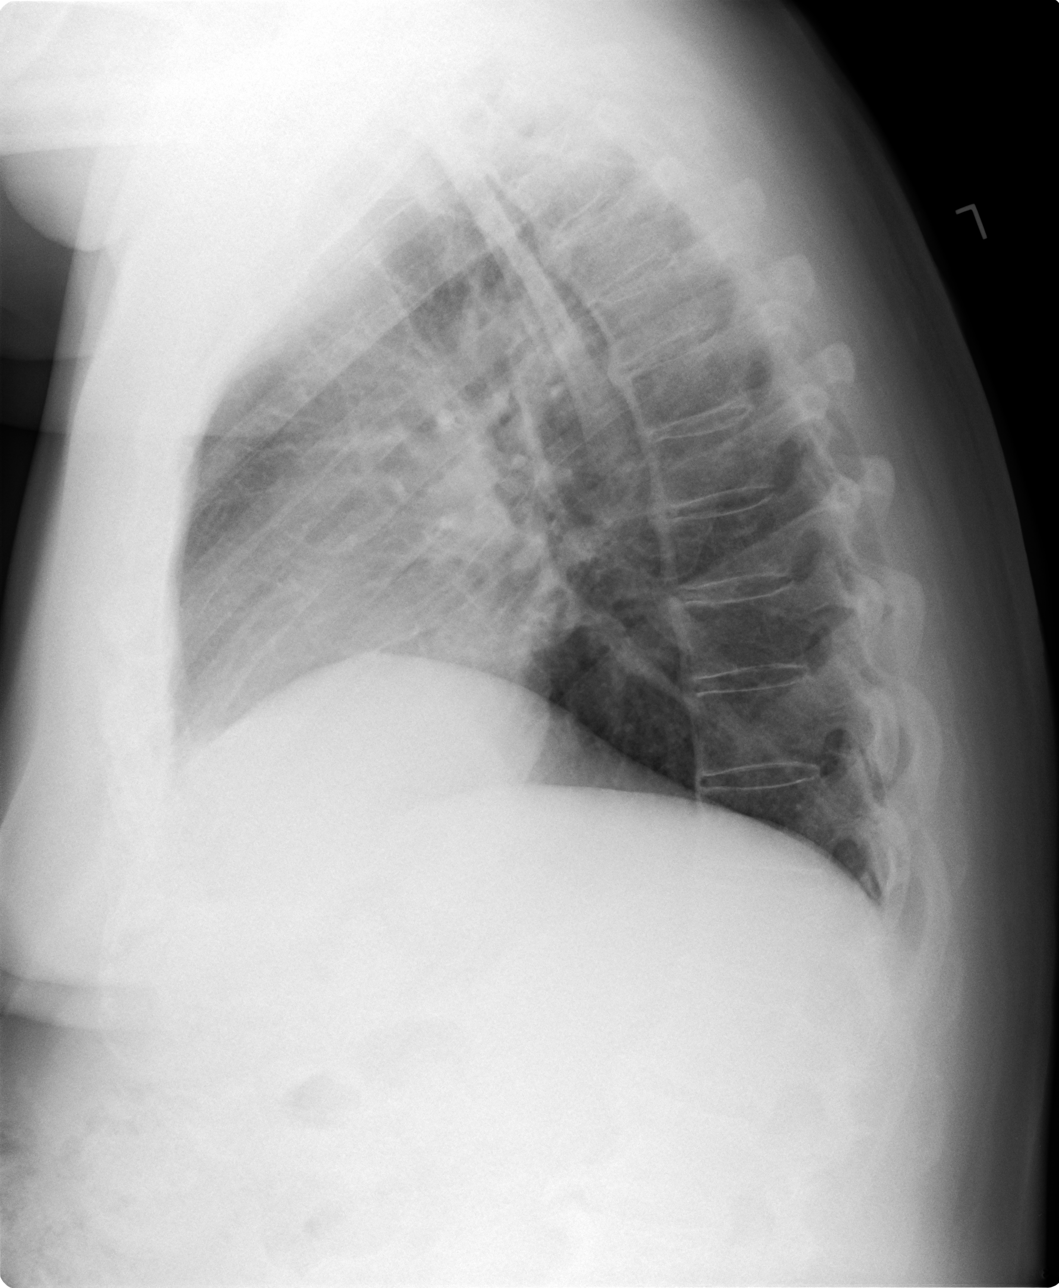

[view not recorded (3 of 3)]
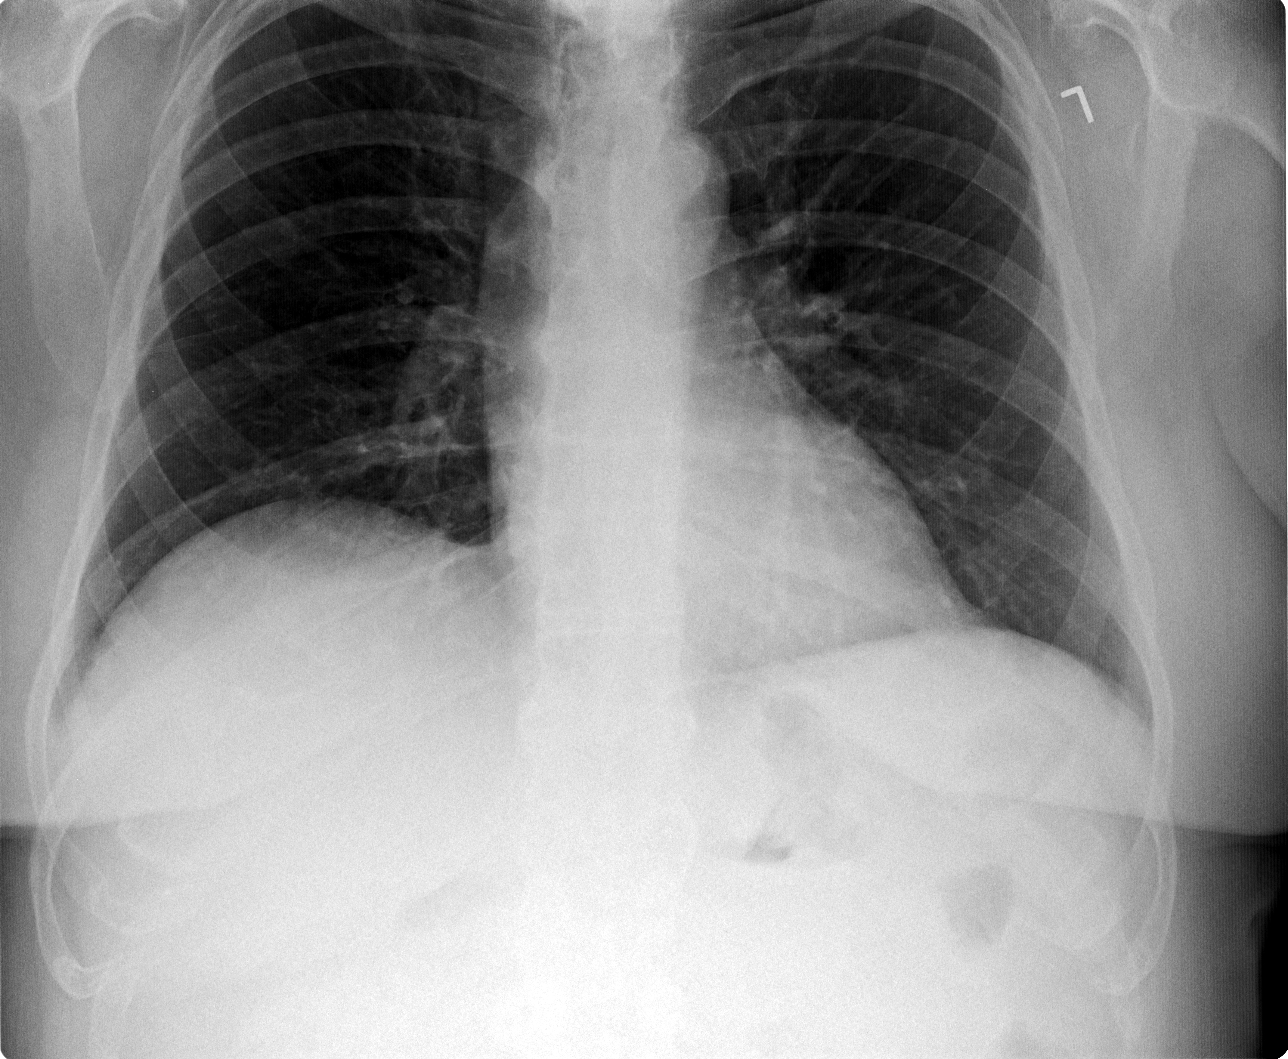

[3 of 3 positions shown; findings below may reference images not displayed]

FINDINGS: The lungs are clear without focal infiltrate, edema, pneumothorax or
pleural effusion. Insert are Imaged bony structures of the thorax
are intact.
IMPRESSION: No active cardiopulmonary disease.

## 2015-03-14 ENCOUNTER — Other Ambulatory Visit: Payer: Self-pay | Admitting: Family

## 2015-04-16 ENCOUNTER — Other Ambulatory Visit: Payer: Self-pay | Admitting: Family

## 2015-07-21 ENCOUNTER — Ambulatory Visit (INDEPENDENT_AMBULATORY_CARE_PROVIDER_SITE_OTHER): Payer: Medicare Other | Admitting: Family

## 2015-07-21 ENCOUNTER — Encounter: Payer: Self-pay | Admitting: Family

## 2015-07-21 VITALS — BP 156/81 | HR 72 | Temp 97.6°F | Ht 67.0 in | Wt 213.2 lb

## 2015-07-21 DIAGNOSIS — K219 Gastro-esophageal reflux disease without esophagitis: Secondary | ICD-10-CM

## 2015-07-21 DIAGNOSIS — E785 Hyperlipidemia, unspecified: Secondary | ICD-10-CM

## 2015-07-21 DIAGNOSIS — F319 Bipolar disorder, unspecified: Secondary | ICD-10-CM

## 2015-07-21 DIAGNOSIS — G4700 Insomnia, unspecified: Secondary | ICD-10-CM

## 2015-07-21 DIAGNOSIS — Z23 Encounter for immunization: Secondary | ICD-10-CM | POA: Diagnosis not present

## 2015-07-21 DIAGNOSIS — N189 Chronic kidney disease, unspecified: Secondary | ICD-10-CM

## 2015-07-21 DIAGNOSIS — E118 Type 2 diabetes mellitus with unspecified complications: Secondary | ICD-10-CM

## 2015-07-21 DIAGNOSIS — I10 Essential (primary) hypertension: Secondary | ICD-10-CM

## 2015-07-21 LAB — POCT GLYCOSYLATED HEMOGLOBIN (HGB A1C): Hemoglobin A1C: 5.6

## 2015-07-21 LAB — POCT UA - MICROALBUMIN: MICROALBUMIN (UR) POC: 100 mg/L

## 2015-07-21 MED ORDER — ROSUVASTATIN CALCIUM 20 MG PO TABS: 20.0000 mg | ORAL_TABLET | Freq: Every day | ORAL | Status: DC

## 2015-07-21 MED ORDER — AMLODIPINE BESYLATE 10 MG PO TABS: ORAL_TABLET | ORAL | Status: DC

## 2015-07-21 NOTE — Progress Notes (Signed)
Subjective:    Patient ID: Charles Moyer, male    DOB: Mar 11, 1926, 81 y.o.   MRN: [id number]  Pt presents to the office today for chronic follow up. Pt has not taken any of his medications since March 2016 except his Seroquel. Pt states he "ran out" and just never refilled them. Pt is a poor historian.  Hypertension This is a chronic problem. The current episode started more than 1 year ago. The problem is unchanged. The problem is uncontrolled. Pertinent negatives include no anxiety, blurred vision, headaches, palpitations, peripheral edema or shortness of breath. Risk factors for coronary artery disease include diabetes mellitus, dyslipidemia, male gender, sedentary lifestyle and smoking/tobacco exposure. Past treatments include nothing. The current treatment provides no improvement. Hypertensive end-organ damage includes kidney disease. There is no history of CAD/MI, CVA or heart failure. There is no history of sleep apnea.  Hyperlipidemia This is a chronic problem. The current episode started more than 1 year ago. The problem is controlled. Recent lipid tests were reviewed and are normal. Exacerbating diseases include diabetes. Factors aggravating his hyperlipidemia include smoking and fatty foods. Pertinent negatives include no leg pain or shortness of breath. Current antihyperlipidemic treatment includes statins (Pt has not taken it in "awhile"). The current treatment provides no improvement of lipids. Risk factors for coronary artery disease include diabetes mellitus, dyslipidemia, hypertension, male sex, obesity and a sedentary lifestyle.  Diabetes He presents for his follow-up diabetic visit. He has type 2 diabetes mellitus. His disease course has been worsening. Pertinent negatives for hypoglycemia include no confusion or headaches. Pertinent negatives for diabetes include no blurred vision, no foot paresthesias, no foot ulcerations and no visual change. Pertinent negatives for hypoglycemia  complications include no blackouts. Symptoms are worsening. Pertinent negatives for diabetic complications include no CVA, heart disease or peripheral neuropathy. Risk factors for coronary artery disease include diabetes mellitus, dyslipidemia, hypertension, male sex, obesity, post-menopausal and sedentary lifestyle. Current diabetic treatment includes oral agent (monotherapy). He is compliant with treatment some of the time. He is following a generally unhealthy diet. (Pt states he does not check his blood sugars at home ) ACE inhibitor/angiotensin II receptor blocker: ACE is ordered- Pt has not taken since March. Eye exam is not current.  Gastrophageal Reflux He reports no belching, no coughing, no heartburn, no sore throat or no tooth decay. This is a chronic problem. The current episode started more than 1 year ago. The problem occurs occasionally. The problem has been waxing and waning. The symptoms are aggravated by certain foods and lying down. Risk factors include smoking/tobacco exposure and obesity. He has tried an antacid for the symptoms. The treatment provided moderate relief.      Review of Systems  Constitutional: Negative.   HENT: Negative.  Negative for sore throat.   Eyes: Negative for blurred vision.  Respiratory: Negative.  Negative for cough and shortness of breath.   Cardiovascular: Negative.  Negative for palpitations.  Gastrointestinal: Negative.  Negative for heartburn.  Endocrine: Negative.   Genitourinary: Negative.   Musculoskeletal: Negative.   Neurological: Negative.  Negative for headaches.  Hematological: Negative.   Psychiatric/Behavioral: Negative.  Negative for confusion.  All other systems reviewed and are negative.      Objective:   Physical Exam  Constitutional: He is oriented to person, place, and time. He appears well-developed and well-nourished. No distress.  HENT:  Head: Normocephalic.  Right Ear: External ear normal.  Left Ear: External ear  normal.  Mouth/Throat: Oropharynx is clear and moist.  Eyes: Pupils are equal, round, and reactive to light. Right eye exhibits no discharge. Left eye exhibits no discharge.  Neck: Normal range of motion. Neck supple. No thyromegaly present.  Cardiovascular: Normal rate, regular rhythm and intact distal pulses.   Murmur heard. Pulmonary/Chest: Effort normal and breath sounds normal. No respiratory distress. He has no wheezes.  Abdominal: Soft. Bowel sounds are normal. He exhibits no distension. There is no tenderness.  Musculoskeletal: Normal range of motion. He exhibits no edema or tenderness.  Neurological: He is alert and oriented to person, place, and time. He has normal reflexes. No cranial nerve deficit.  Skin: Skin is warm and dry. No rash noted. No erythema.  Psychiatric: He has a normal mood and affect. His behavior is normal. Judgment and thought content normal.  Vitals reviewed.     BP 156/81 mmHg  Pulse 72  Temp(Src) 97.6 F (36.4 C) (Oral)  Ht _0  (1.702 m)  Wt 213 lb 3.2 oz (96.707 kg)  BMI 33.38 kg/m2     Assessment & Plan:  1. Essential hypertension -Pt's Norvasc reordered today  CMP14+EGFR - Microalbumin, urine - amLODipine (NORVASC) 10 MG tablet; TAKE 1 TABLET (10 MG TOTAL) BY MOUTH DAILY.  Dispense: 90 tablet; Refill: 3  2. Type 2 diabetes mellitus with complication - POCT UA - Microalbumin - Microalbumin, urine - POCT glycosylated hemoglobin (Hb A1C) - Ambulatory referral to Ophthalmology  3. Hyperlipidemia - Lipid panel - Microalbumin, urine  4. CKD (chronic kidney disease), unspecified stage - Microalbumin, urine  5. Insomnia  6. Bipolar 1 disorder  7. Gastroesophageal reflux disease, esophagitis presence not specified    Continue all meds Labs pending Health Maintenance reviewed- Prevnar given today Diet and exercise encouraged RTO 2 weeks to recheck HTN  Evelina Dun, FNP

## 2015-07-21 NOTE — Addendum Note (Signed)
Addended by: Karle Plumber on: 07/21/2015 03:38 PM   Modules accepted: Orders

## 2015-07-21 NOTE — Patient Instructions (Signed)

## 2015-07-22 LAB — CMP14+EGFR
ALT: 20 IU/L (ref 0–44)
AST: 9 IU/L (ref 0–40)
Albumin/Globulin Ratio: 1.7 (ref 1.1–2.5)
Albumin: 4.2 g/dL (ref 3.5–4.7)
Alkaline Phosphatase: 77 IU/L (ref 39–117)
BUN/Creatinine Ratio: 16 (ref 10–22)
BUN: 16 mg/dL (ref 8–27)
Bilirubin Total: 0.2 mg/dL (ref 0.0–1.2)
CALCIUM: 9.5 mg/dL (ref 8.6–10.2)
CO2: 26 mmol/L (ref 18–29)
CREATININE: 1.01 mg/dL (ref 0.76–1.27)
Chloride: 105 mmol/L (ref 97–108)
GFR calc Af Amer: 77 mL/min/{1.73_m2} (ref >59)
GFR, EST NON AFRICAN AMERICAN: 67 mL/min/{1.73_m2} (ref >59)
Globulin, Total: 2.5 g/dL (ref 1.5–4.5)
Glucose: 97 mg/dL (ref 65–99)
Potassium: 5 mmol/L (ref 3.5–5.2)
Sodium: 146 mmol/L — ABNORMAL HIGH (ref 134–144)
Total Protein: 6.7 g/dL (ref 6.0–8.5)

## 2015-07-22 LAB — LIPID PANEL
Chol/HDL Ratio: 8.7 ratio units — ABNORMAL HIGH (ref 0.0–5.0)
Cholesterol, Total: 208 mg/dL — ABNORMAL HIGH (ref 100–199)
HDL: 24 mg/dL — ABNORMAL LOW (ref >39)
LDL CALC: 121 mg/dL — AB (ref 0–99)
Triglycerides: 314 mg/dL — ABNORMAL HIGH (ref 0–149)
VLDL Cholesterol Cal: 63 mg/dL — ABNORMAL HIGH (ref 5–40)

## 2015-07-22 LAB — MICROALBUMIN, URINE: Microalbumin, Urine: 327.3 ug/mL

## 2015-07-23 ENCOUNTER — Other Ambulatory Visit: Payer: Self-pay | Admitting: Family

## 2015-07-23 MED ORDER — LISINOPRIL 5 MG PO TABS: 5.0000 mg | ORAL_TABLET | Freq: Every day | ORAL | Status: DC

## 2015-07-30 ENCOUNTER — Telehealth: Payer: Self-pay

## 2015-07-30 MED ORDER — ATORVASTATIN CALCIUM 40 MG PO TABS: 40.0000 mg | ORAL_TABLET | Freq: Every day | ORAL | Status: DC

## 2015-07-30 NOTE — Telephone Encounter (Signed)
Insurance denied Rosuvastatin Calcium 20 mg

## 2015-07-30 NOTE — Telephone Encounter (Signed)
Aware of lipitor script.

## 2015-07-30 NOTE — Telephone Encounter (Signed)
Insurance denied Crestor. Lipitor rx sent to pharmacy

## 2015-08-04 ENCOUNTER — Ambulatory Visit: Payer: Medicare Other | Admitting: Family

## 2015-08-08 ENCOUNTER — Encounter: Payer: Self-pay | Admitting: Family

## 2015-09-24 ENCOUNTER — Ambulatory Visit: Payer: Medicare Other | Admitting: Family Medicine

## 2015-10-01 ENCOUNTER — Ambulatory Visit: Payer: Medicare Other | Admitting: Family Medicine

## 2015-10-03 ENCOUNTER — Encounter: Payer: Self-pay | Admitting: Family

## 2016-04-26 ENCOUNTER — Ambulatory Visit (INDEPENDENT_AMBULATORY_CARE_PROVIDER_SITE_OTHER): Payer: Medicare Other | Admitting: Family Medicine

## 2016-04-26 ENCOUNTER — Encounter: Payer: Self-pay | Admitting: Family Medicine

## 2016-04-26 VITALS — BP 163/80 | HR 83 | Temp 97.6°F | Ht 67.0 in | Wt 214.6 lb

## 2016-04-26 DIAGNOSIS — E785 Hyperlipidemia, unspecified: Secondary | ICD-10-CM | POA: Diagnosis not present

## 2016-04-26 DIAGNOSIS — E119 Type 2 diabetes mellitus without complications: Secondary | ICD-10-CM | POA: Diagnosis not present

## 2016-04-26 DIAGNOSIS — I10 Essential (primary) hypertension: Secondary | ICD-10-CM

## 2016-04-26 DIAGNOSIS — Z720 Tobacco use: Secondary | ICD-10-CM

## 2016-04-26 LAB — BAYER DCA HB A1C WAIVED: HB A1C: 5.7 % (ref <7.0)

## 2016-04-26 MED ORDER — AMLODIPINE BESYLATE 2.5 MG PO TABS: 2.5000 mg | ORAL_TABLET | Freq: Every day | ORAL | Status: DC

## 2016-04-26 NOTE — Patient Instructions (Addendum)
Great to meet you!  I would like you to start norvasc (amlodipine) 1 pill daily for high blood pressure  Lets see you again in 1-2 months for your blood pressure

## 2016-04-26 NOTE — Progress Notes (Signed)
   HPI  Patient presents today here for follow-up of diabetes, hypertension, and smoking.  Patient smokes cigars, not really willing to quit yet.  Type 2 diabetes Diet controlled Not really watching his diet, not checking sugars routinely, checks occasionally and the averages 120 to 130  Hypertension Not taking any medications No chest pain, shortness of breath, dyspnea   PMH: Smoking status noted ROS: Per HPI  Objective: BP 163/80 mmHg  Pulse 83  Temp(Src) 97.6 F (36.4 C) (Oral)  Ht '5\' 7"'$  (1.702 m)  Wt 214 lb 9.6 oz (97.342 kg)  BMI 33.60 kg/m2 Gen: NAD, alert, cooperative with exam HEENT: NCAT nares clear, TM on the left normal, on the right partially obscured by cerumen, oropharynx clear CV: RRR, good S1/S2, no murmur Resp: CTABL, no wheezes, non-labored Abd: SNTND, BS present, no guarding or organomegaly Ext: No edema, warm Neuro: Alert and oriented, No gross deficits  Assessment and plan:  # Type 2 diabetes Previously well controlled with diet only Gets ophthalmology in [place] No meds for now, A1c  # Hypertension Slightly elevated, goal at 87 150/90 or less 2.5 mg Norvasc  # Hyperlipidemia Willing to take statin, however states his cardiologist stopped it, considering age I did not recheck cholesterol at this time.  # Tobacco abuse Not willing to quit, discussed     Orders Placed This Encounter  Procedures  . CMP14+EGFR  . Bayer DCA Hb A1c Waived    Meds ordered this encounter  Medications  . amLODipine (NORVASC) 2.5 MG tablet    Sig: Take 1 tablet (2.5 mg total) by mouth daily.    Dispense:  30 tablet    Refill:  [place], MD [facility] 04/26/2016, 2:44 PM

## 2016-04-27 LAB — CMP14+EGFR
ALBUMIN: 4.2 g/dL (ref 3.5–4.7)
ALK PHOS: 58 IU/L (ref 39–117)
ALT: 24 IU/L (ref 0–44)
AST: 18 IU/L (ref 0–40)
Albumin/Globulin Ratio: 1.5 (ref 1.2–2.2)
BUN / CREAT RATIO: 10 (ref 10–24)
BUN: 11 mg/dL (ref 8–27)
Bilirubin Total: 0.3 mg/dL (ref 0.0–1.2)
CO2: 23 mmol/L (ref 18–29)
Calcium: 8.8 mg/dL (ref 8.6–10.2)
Chloride: 104 mmol/L (ref 96–106)
Creatinine, Ser: 1.09 mg/dL (ref 0.76–1.27)
GFR calc Af Amer: 70 mL/min/{1.73_m2} (ref >59)
GFR, EST NON AFRICAN AMERICAN: 61 mL/min/{1.73_m2} (ref >59)
Globulin, Total: 2.8 g/dL (ref 1.5–4.5)
Glucose: 106 mg/dL — ABNORMAL HIGH (ref 65–99)
Potassium: 4.6 mmol/L (ref 3.5–5.2)
SODIUM: 143 mmol/L (ref 134–144)
Total Protein: 7 g/dL (ref 6.0–8.5)

## 2016-06-25 ENCOUNTER — Ambulatory Visit: Payer: Self-pay | Admitting: Family Medicine

## 2016-06-28 ENCOUNTER — Ambulatory Visit: Payer: Medicare Other | Admitting: Family Medicine

## 2016-07-20 ENCOUNTER — Encounter: Payer: Self-pay | Admitting: Family Medicine

## 2016-08-23 ENCOUNTER — Encounter: Payer: Self-pay | Admitting: Family Medicine

## 2016-08-23 ENCOUNTER — Ambulatory Visit (INDEPENDENT_AMBULATORY_CARE_PROVIDER_SITE_OTHER): Payer: Medicare Other | Admitting: Family Medicine

## 2016-08-23 VITALS — BP 137/80 | HR 74 | Temp 97.5°F | Ht 67.0 in | Wt 210.2 lb

## 2016-08-23 DIAGNOSIS — E119 Type 2 diabetes mellitus without complications: Secondary | ICD-10-CM | POA: Diagnosis not present

## 2016-08-23 DIAGNOSIS — Z720 Tobacco use: Secondary | ICD-10-CM

## 2016-08-23 DIAGNOSIS — E785 Hyperlipidemia, unspecified: Secondary | ICD-10-CM | POA: Diagnosis not present

## 2016-08-23 DIAGNOSIS — I10 Essential (primary) hypertension: Secondary | ICD-10-CM

## 2016-08-23 LAB — BAYER DCA HB A1C WAIVED: HB A1C (BAYER DCA - WAIVED): 5.4 % (ref <7.0)

## 2016-08-23 NOTE — Patient Instructions (Signed)
Great to see you!   Come back in 4 months for follow up of diabetes

## 2016-08-23 NOTE — Progress Notes (Signed)
   HPI  Patient presents today here for follow-up of type 2 diabetes, hypertension, hyperlipidemia.  Diabetes No medications, diet control Not watching diet, he is active, however no formal exercise regimen.  Not checking blood pressures or blood sugars.  Good medication compliance with hypertension medications. Denies chest pain, dyspnea, palpitations, or leg edema.  He has 4 children, his wife died in 2004/03/28 from bladder cancer. History children live near him, [place] a caf.  PMH: Smoking status noted ROS: Per HPI  Objective: BP 137/80   Pulse 74   Temp 97.5 F (36.4 C) (Oral)   Ht '5\' 7"'$  (1.702 m)   Wt 210 lb 3.2 oz (95.3 kg)   BMI 32.92 kg/m  Gen: NAD, alert, cooperative with exam HEENT: NCAT CV: RRR, good S1/S2, no murmur Resp: CTABL, no wheezes, non-labored Abd: SNTND, BS present, no guarding or organomegaly Ext: No edema, warm Neuro: Alert and oriented, No gross deficits  Diabetic Foot Exam - Simple   Simple Foot Form Visual Inspection See comments:  Yes Sensation Testing Intact to touch and monofilament testing bilaterally:  Yes Pulse Check Posterior Tibialis and Dorsalis pulse intact bilaterally:  Yes Comments Right lateral foot with healing laceration, no bleeding or tenderness at this time      Assessment and plan:  # Hypertension Well-controlled Continue amlodipine Labs  # Type 2 diabetes Diet controlled No interventions at this time A1c 5.4  # Tobacco abuse Discussed, not ready to quit  # Hyperlipidemia Outside of the window for statins Labs Discussed therapeutic lifestyle changes     Orders Placed This Encounter  Procedures  . Bayer DCA Hb A1c Waived  . Lipid panel  . CMP14+EGFR  . CBC with Differential    No orders of the defined types were placed in this encounter.   Laroy Apple, MD [facility] 08/23/2016, 3:30 PM

## 2016-08-24 LAB — CBC WITH DIFFERENTIAL/PLATELET
BASOS ABS: 0.1 10*3/uL (ref 0.0–0.2)
BASOS: 1 %
EOS (ABSOLUTE): 0.3 10*3/uL (ref 0.0–0.4)
EOS: 5 %
HEMATOCRIT: 43.7 % (ref 37.5–51.0)
HEMOGLOBIN: 14.5 g/dL (ref 12.6–17.7)
IMMATURE GRANS (ABS): 0 10*3/uL (ref 0.0–0.1)
Immature Granulocytes: 0 %
LYMPHS ABS: 2.3 10*3/uL (ref 0.7–3.1)
LYMPHS: 39 %
MCH: 31.7 pg (ref 26.6–33.0)
MCHC: 33.2 g/dL (ref 31.5–35.7)
MCV: 96 fL (ref 79–97)
MONOCYTES: 6 %
Monocytes Absolute: 0.4 10*3/uL (ref 0.1–0.9)
NEUTROS ABS: 2.9 10*3/uL (ref 1.4–7.0)
Neutrophils: 49 %
Platelets: 193 10*3/uL (ref 150–379)
RBC: 4.57 x10E6/uL (ref 4.14–5.80)
RDW: 14.8 % (ref 12.3–15.4)
WBC: 5.9 10*3/uL (ref 3.4–10.8)

## 2016-08-24 LAB — LIPID PANEL
Chol/HDL Ratio: 8.3 ratio units — ABNORMAL HIGH (ref 0.0–5.0)
Cholesterol, Total: 200 mg/dL — ABNORMAL HIGH (ref 100–199)
HDL: 24 mg/dL — AB (ref >39)
LDL CALC: 120 mg/dL — AB (ref 0–99)
Triglycerides: 281 mg/dL — ABNORMAL HIGH (ref 0–149)
VLDL CHOLESTEROL CAL: 56 mg/dL — AB (ref 5–40)

## 2016-08-24 LAB — CMP14+EGFR
ALBUMIN: 3.9 g/dL (ref 3.5–4.7)
ALK PHOS: 59 IU/L (ref 39–117)
ALT: 16 IU/L (ref 0–44)
AST: 11 IU/L (ref 0–40)
Albumin/Globulin Ratio: 1.4 (ref 1.2–2.2)
BILIRUBIN TOTAL: 0.3 mg/dL (ref 0.0–1.2)
BUN / CREAT RATIO: 14 (ref 10–24)
BUN: 16 mg/dL (ref 8–27)
CO2: 23 mmol/L (ref 18–29)
CREATININE: 1.12 mg/dL (ref 0.76–1.27)
Calcium: 9.3 mg/dL (ref 8.6–10.2)
Chloride: 105 mmol/L (ref 96–106)
GFR, EST AFRICAN AMERICAN: 68 mL/min/{1.73_m2} (ref >59)
GFR, EST NON AFRICAN AMERICAN: 59 mL/min/{1.73_m2} — AB (ref >59)
GLOBULIN, TOTAL: 2.8 g/dL (ref 1.5–4.5)
GLUCOSE: 97 mg/dL (ref 65–99)
Potassium: 4.4 mmol/L (ref 3.5–5.2)
Sodium: 146 mmol/L — ABNORMAL HIGH (ref 134–144)
Total Protein: 6.7 g/dL (ref 6.0–8.5)

## 2017-07-21 ENCOUNTER — Ambulatory Visit: Payer: Medicare Other

## 2017-07-22 ENCOUNTER — Ambulatory Visit: Payer: Medicare Other

## 2017-07-25 ENCOUNTER — Encounter: Payer: Self-pay | Admitting: Family Medicine

## 2017-09-07 ENCOUNTER — Encounter: Payer: Self-pay | Admitting: *Deleted

## 2017-09-07 ENCOUNTER — Ambulatory Visit (INDEPENDENT_AMBULATORY_CARE_PROVIDER_SITE_OTHER): Payer: Medicare Other | Admitting: *Deleted

## 2017-09-07 VITALS — BP 143/79 | HR 74 | Ht 63.75 in | Wt 197.0 lb

## 2017-09-07 DIAGNOSIS — Z0000 Encounter for general adult medical examination without abnormal findings: Secondary | ICD-10-CM | POA: Diagnosis not present

## 2017-09-07 NOTE — Patient Instructions (Addendum)
  Charles Moyer , Thank you for taking time to come for your Medicare Wellness Visit. I appreciate your ongoing commitment to your health goals. Please review the following plan we discussed and let me know if I can assist you in the future.   These are the goals we discussed: Goals    . Exercise 150 minutes per week (moderate activity)      Chair exercises daily. Handout given.   This is a list of the screening recommended for you and due dates:  Health Maintenance  Topic Date Due  . Eye exam for diabetics  10/10/1936  . Complete foot exam   07/20/2016  . Urine Protein Check  07/20/2016  . Hemoglobin A1C  02/23/2017  . Flu Shot  07/27/2017  . Tetanus Vaccine  04/01/2024  . Pneumonia vaccines  Completed   Keep your physical appointment with Dr Wendi Snipes. Move carefully to avoid falls.  Review Advance Directives and bring a signed/notarized copy to our office.

## 2017-09-09 NOTE — Progress Notes (Signed)
Subjective:   Charles Moyer is a 83 y.o. male who presents for an Initial Medicare Annual Wellness Visit. Charles Moyer has lived at home alone since his wife passed away in 04-17-04. He has 2 adult daughters and 1 adult son. He had another son to pass away at age 15. He has a parrot as a Loss adjuster, chartered.   Review of Systems  Reports that his health is about the same as last year.   Cardiac Risk Factors include: advanced age (>30men, >6 women);diabetes mellitus;dyslipidemia;male gender;hypertension;sedentary lifestyle   Other systems negative today.    Objective:    Today's Vitals   09/07/17 1543  BP: (!) 143/79  Pulse: 74  Weight: 197 lb (89.4 kg)  Height: 5' 3.75" (1.619 m)   Body mass index is 34.08 kg/m.  Current Medications (verified) Outpatient Encounter Prescriptions as of 09/07/2017  Medication Sig  . amLODipine (NORVASC) 2.5 MG tablet Take 1 tablet (2.5 mg total) by mouth daily.  [name] aspirin 81 MG tablet Take 81 mg by mouth daily.    . QUEtiapine (SEROQUEL) 300 MG tablet Take 300 mg by mouth at bedtime.     No facility-administered encounter medications on file as of 09/07/2017.     Allergies (verified) Niaspan [niacin er]   History: Past Medical History:  Diagnosis Date  . Aortic stenosis   . Bipolar disorder ([place])   . Cancer [facility])    prostate  . Diabetes mellitus without complication ([place])   . Hyperlipidemia   . Hypertension   . Pes planus of both feet    Past Surgical History:  Procedure Laterality Date  . KNEE SURGERY Left    Family History  Problem Relation Age of Onset  . Cancer Brother    Social History   Occupational History  . Not on file.   Social History Main Topics  . Smoking status: Current Every Day Smoker    Types: Cigars    Last attempt to quit: 04/30/1973  . Smokeless tobacco: Never Used     Comment: cigar after he eats meal  . Alcohol use No  . Drug use: No  . Sexual activity: No   Tobacco Counseling Ready to quit: Not Answered Counseling  given: Not Answered Patient smokes cigars and is not interested in quitting at this time.   Activities of Daily Living In your present state of health, do you have any difficulty performing the following activities: 09/07/2017  Hearing? N  Vision? N  Difficulty concentrating or making decisions? N  Walking or climbing stairs? N  Dressing or bathing? N  Doing errands, shopping? N  Preparing Food and eating ? N  Using the Toilet? N  In the past six months, have you accidently leaked urine? N  Do you have problems with loss of bowel control? N  Managing your Medications? N  Managing your Finances? N  Housekeeping or managing your Housekeeping? N  Comment Has someone come in every 3 weeks to do a big cleaning. He cleans the other days  Some recent data might be hidden    Immunizations and Health Maintenance Immunization History  Administered Date(s) Administered  . Influenza Split 11/19/2013  . Pneumococcal Conjugate-13 07/21/2015  . Pneumococcal Polysaccharide-23 05/27/1997  . Td 05/27/1997  . Tdap 04/01/2014   Health Maintenance Due  Topic Date Due  . OPHTHALMOLOGY EXAM  10/10/1936  . FOOT EXAM  07/20/2016  . URINE MICROALBUMIN  07/20/2016  . HEMOGLOBIN A1C  02/23/2017  . INFLUENZA VACCINE  07/27/2017  Patient Care Team: Timmothy Euler, MD as PCP - General (Family Medicine)      Assessment:   This is a routine wellness examination for Charles Moyer.   Hearing/Vision screen No deficits noted during visit. Patient is overdue for an eye exam.  Dietary issues and exercise activities discussed: Current Exercise Habits: The patient does not participate in regular exercise at present (Walks around yard and stays busy in home. )  Goals    . Exercise 150 minutes per week (moderate activity)      Depression Screen PHQ 2/9 Scores 09/07/2017 08/23/2016 04/26/2016 07/21/2015  PHQ - 2 Score 0 0 0 0    Fall Risk Fall Risk  09/07/2017 08/23/2016 04/26/2016 07/21/2015 04/01/2014  Falls  in the past year? No No No No No    Cognitive Function: MMSE - Mini Mental State Exam 09/07/2017  Orientation to time 5  Orientation to Place 5  Registration 3  Attention/ Calculation 5  Recall 1  Language- name 2 objects 2  Language- repeat 1  Language- follow 3 step command 3  Language- read & follow direction 1  Write a sentence 1  Copy design 1  Total score 28   Normal exam    Screening Tests Health Maintenance  Topic Date Due  . OPHTHALMOLOGY EXAM  10/10/1936  . FOOT EXAM  07/20/2016  . URINE MICROALBUMIN  07/20/2016  . HEMOGLOBIN A1C  02/23/2017  . INFLUENZA VACCINE  07/27/2017  . TETANUS/TDAP  04/01/2024  . PNA vac Low Risk Adult  Completed       No hospitalizations, ER visits, or surgeries this past year.   Plan:  Keep your physical appointment with Dr Wendi Snipes. Move carefully to avoid falls.  Review Advance Directives and bring a signed/notarized copy to our offi  I have personally reviewed and noted the following in the patient's chart:   . Medical and social history . Use of alcohol, tobacco or illicit drugs  . Current medications and supplements . Functional ability and status . Nutritional status . Physical activity . Advanced directives . List of other physicians . Hospitalizations, surgeries, and ER visits in previous 12 months . Vitals . Screenings to include cognitive, depression, and falls . Referrals and appointments  In addition, I have reviewed and discussed with patient certain preventive protocols, quality metrics, and best practice recommendations. A written personalized care plan for preventive services as well as general preventive health recommendations were provided to patient.     Chong Sicilian, RN   09/09/2017     I have reviewed and agree with the above AWV documentation.   Laroy Apple, MD [facility] 09/13/2017, 10:46 AM

## 2017-10-06 ENCOUNTER — Encounter: Payer: Medicare Other | Admitting: Family Medicine

## 2017-10-18 ENCOUNTER — Ambulatory Visit (INDEPENDENT_AMBULATORY_CARE_PROVIDER_SITE_OTHER): Payer: Medicare Other | Admitting: Family Medicine

## 2017-10-18 ENCOUNTER — Encounter: Payer: Self-pay | Admitting: Family Medicine

## 2017-10-18 VITALS — BP 133/73 | HR 104 | Temp 97.1°F | Ht 63.75 in | Wt 195.6 lb

## 2017-10-18 DIAGNOSIS — E785 Hyperlipidemia, unspecified: Secondary | ICD-10-CM

## 2017-10-18 DIAGNOSIS — Z23 Encounter for immunization: Secondary | ICD-10-CM

## 2017-10-18 DIAGNOSIS — Z0000 Encounter for general adult medical examination without abnormal findings: Secondary | ICD-10-CM

## 2017-10-18 DIAGNOSIS — I10 Essential (primary) hypertension: Secondary | ICD-10-CM

## 2017-10-18 DIAGNOSIS — E119 Type 2 diabetes mellitus without complications: Secondary | ICD-10-CM

## 2017-10-18 LAB — BAYER DCA HB A1C WAIVED: HB A1C (BAYER DCA - WAIVED): 5.5 % (ref <7.0)

## 2017-10-18 MED ORDER — AMLODIPINE BESYLATE 2.5 MG PO TABS: 2.5000 mg | ORAL_TABLET | Freq: Every day | ORAL | 3 refills | Status: AC

## 2017-10-18 NOTE — Progress Notes (Signed)
   HPI  Patient presents today here for an annual physical exam and to follow-up for chronic medical conditions.  Type 2 diabetes Historical diagnosis, patient does not watch his diet or check his blood sugar.  Previous A1c's have been very well controlled.  Hyperlipidemia No medication. Not watching diet. Nonfasting today.  Hypertension Good medication compliance with amlodipine, does not have any headaches or chest pain.   PMH: Smoking status noted ROS: Per HPI  Objective: BP 133/73   Pulse (!) 104   Temp (!) 97.1 F (36.2 C) (Oral)   Ht 5' 3.75" (1.619 m)   Wt 195 lb 9.6 oz (88.7 kg)   BMI 33.84 kg/m  Gen: NAD, alert, cooperative with exam HEENT: NCAT, EOMI, PERRL CV: RRR, good S1/S2, 2/6 to 3/6 systolic murmur loudest at the right sternal border. Resp: CTABL, no wheezes, non-labored Abd: SNTND, BS present, no guarding or organomegaly Ext: No edema, warm Neuro: Alert and oriented, strength 5/5 in bilateral lower extremities  Assessment and plan:  #Annual physical exam Normal exam, BMI reasonable given his age. Labs today  #Hypertension Well-controlled No changes, refill amlodipine Labs  #Type 2 diabetes Diet-controlled, A1c, urine protein creatinine ratio ordered  #Hyperlipidemia Repeat labs, no changes, likely no medication will be prescribed given his age, however checked for risk stratification  Need for influenza vaccine-counseling for all vaccine components provided    Orders Placed This Encounter  Procedures  . CMP14+EGFR  . CBC with Differential/Platelet  . Lipid panel  . Bayer DCA Hb A1c Waived  . Microalbumin / creatinine urine ratio    Meds ordered this encounter  Medications  . amLODipine (NORVASC) 2.5 MG tablet    Sig: Take 1 tablet (2.5 mg total) by mouth daily.    Dispense:  90 tablet    Refill:  [place], MD [facility] 10/18/2017, 2:44 PM

## 2017-10-18 NOTE — Patient Instructions (Addendum)
Great to see you!  We will let you know about labs within 1 week.

## 2017-10-19 LAB — CBC WITH DIFFERENTIAL/PLATELET
Basophils Absolute: 0 x10E3/uL (ref 0.0–0.2)
Basos: 1 %
EOS (ABSOLUTE): 0.4 x10E3/uL (ref 0.0–0.4)
Eos: 7 %
Hematocrit: 41.8 % (ref 37.5–51.0)
Hemoglobin: 14.1 g/dL (ref 13.0–17.7)
Immature Grans (Abs): 0 x10E3/uL (ref 0.0–0.1)
Immature Granulocytes: 0 %
Lymphocytes Absolute: 1.4 x10E3/uL (ref 0.7–3.1)
Lymphs: 28 %
MCH: 33.6 pg — ABNORMAL HIGH (ref 26.6–33.0)
MCHC: 33.7 g/dL (ref 31.5–35.7)
MCV: 100 fL — ABNORMAL HIGH (ref 79–97)
Monocytes Absolute: 0.2 x10E3/uL (ref 0.1–0.9)
Monocytes: 3 %
Neutrophils Absolute: 3 x10E3/uL (ref 1.4–7.0)
Neutrophils: 61 %
Platelets: 168 x10E3/uL (ref 150–379)
RBC: 4.2 x10E6/uL (ref 4.14–5.80)
RDW: 14.9 % (ref 12.3–15.4)
WBC: 5 x10E3/uL (ref 3.4–10.8)

## 2017-10-19 LAB — LIPID PANEL
CHOL/HDL RATIO: 6.1 ratio — AB (ref 0.0–5.0)
Cholesterol, Total: 164 mg/dL (ref 100–199)
HDL: 27 mg/dL — ABNORMAL LOW (ref >39)
LDL CALC: 102 mg/dL — AB (ref 0–99)
TRIGLYCERIDES: 173 mg/dL — AB (ref 0–149)
VLDL Cholesterol Cal: 35 mg/dL (ref 5–40)

## 2017-10-19 LAB — CMP14+EGFR
ALBUMIN: 4 g/dL (ref 3.5–4.7)
ALT: 11 IU/L (ref 0–44)
AST: 10 IU/L (ref 0–40)
Albumin/Globulin Ratio: 1.5 (ref 1.2–2.2)
Alkaline Phosphatase: 60 IU/L (ref 39–117)
BUN / CREAT RATIO: 18 (ref 10–24)
BUN: 25 mg/dL (ref 8–27)
Bilirubin Total: 0.2 mg/dL (ref 0.0–1.2)
CALCIUM: 9.2 mg/dL (ref 8.6–10.2)
CO2: 20 mmol/L (ref 20–29)
CREATININE: 1.37 mg/dL — AB (ref 0.76–1.27)
Chloride: 113 mmol/L — ABNORMAL HIGH (ref 96–106)
GFR calc Af Amer: 52 mL/min/{1.73_m2} — ABNORMAL LOW (ref >59)
GFR, EST NON AFRICAN AMERICAN: 45 mL/min/{1.73_m2} — AB (ref >59)
GLOBULIN, TOTAL: 2.6 g/dL (ref 1.5–4.5)
Glucose: 176 mg/dL — ABNORMAL HIGH (ref 65–99)
Potassium: 4.4 mmol/L (ref 3.5–5.2)
SODIUM: 147 mmol/L — AB (ref 134–144)
TOTAL PROTEIN: 6.6 g/dL (ref 6.0–8.5)

## 2017-10-19 LAB — MICROALBUMIN / CREATININE URINE RATIO
Creatinine, Urine: 158.5 mg/dL
Microalb/Creat Ratio: 133.2 mg/g{creat} — ABNORMAL HIGH (ref 0.0–30.0)
Microalbumin, Urine: 211.1 ug/mL

## 2017-10-27 DEATH — deceased

## 2017-11-22 ENCOUNTER — Ambulatory Visit: Payer: Self-pay | Admitting: Family Medicine
